# Patient Record
Sex: Male | Born: 1985 | Marital: Single | Smoking: Never smoker
Health system: Southern US, Community
[De-identification: ages and names within clinical notes are randomized; demographics above are authoritative.]

## PROBLEM LIST (undated history)

## (undated) HISTORY — PX: APPENDECTOMY: SHX54

---

## 2013-12-06 ENCOUNTER — Ambulatory Visit: Payer: Self-pay | Admitting: Otolaryngology

## 2014-12-11 ENCOUNTER — Ambulatory Visit (INDEPENDENT_AMBULATORY_CARE_PROVIDER_SITE_OTHER): Payer: BLUE CROSS/BLUE SHIELD | Admitting: Emergency Medicine

## 2014-12-11 ENCOUNTER — Ambulatory Visit (INDEPENDENT_AMBULATORY_CARE_PROVIDER_SITE_OTHER): Payer: BLUE CROSS/BLUE SHIELD

## 2014-12-11 VITALS — BP 124/68 | HR 52 | Temp 97.8°F | Resp 15 | Ht 73.0 in | Wt 218.0 lb

## 2014-12-11 DIAGNOSIS — M79672 Pain in left foot: Secondary | ICD-10-CM
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
## 2014-12-11 NOTE — Progress Notes (Signed)
  Medical screening examination/treatment/procedure(s) were performed by non-physician practitioner and as supervising physician I was immediately available for consultation/collaboration.     

## 2014-12-11 NOTE — Patient Instructions (Signed)
Please wear the cam walker for the next week at home.  When you start feeling less pain in the area, please try toe touch weight bearing. If this feels ok you can progress a little bit each day.  RICE --> rest, ice, compress, elevate often for next couple days.  Aleve every 6 hours for inflammation and pain.  Please come back to see us in approximately one week. We can hopefully progress to stretching and strengthening exercises at that time along with more weight bearing.

## 2014-12-12 ENCOUNTER — Telehealth: Payer: Self-pay | Admitting: Physician Assistant

## 2014-12-12 NOTE — Telephone Encounter (Signed)
Called and left msg with pt. Radiology over read showed possible non displaced fx 5th metatarsal vs normal variant. Left msg informing pt to continue with the cam walker and crutches for one week, no weight bearing until we see him back in one week. Possible repeat xrays at that time. Referral to ortho as needed.

## 2014-12-13 ENCOUNTER — Telehealth: Payer: Self-pay | Admitting: Emergency Medicine

## 2014-12-13 NOTE — Telephone Encounter (Signed)
Patient states that he was unable to return to work on 12/12/2014. He states that his foot is still in a tremendous amount of pain and he works as a [employer] so he is unable work while using crutches. Please advise.   [phone]

## 2014-12-13 NOTE — Telephone Encounter (Signed)
Spoke with pt. He is a [employer] and can't use crutches while he is working. He needs to be out of work. He states there is no other light or sit down duty he can do. Please advise.

## 2014-12-13 NOTE — Telephone Encounter (Signed)
Pt checking on status of this message.  °

## 2014-12-13 NOTE — Telephone Encounter (Signed)
Pt notified that note is ready. Will fax to work for him.

## 2014-12-13 NOTE — Telephone Encounter (Signed)
I've written a note to keep him out of work until he returns to see us early next week.

## 2014-12-18 ENCOUNTER — Ambulatory Visit (INDEPENDENT_AMBULATORY_CARE_PROVIDER_SITE_OTHER): Payer: BLUE CROSS/BLUE SHIELD | Admitting: Physician Assistant

## 2014-12-18 VITALS — BP 130/60 | HR 58 | Temp 97.8°F | Resp 16 | Ht 73.0 in | Wt 219.8 lb

## 2014-12-18 DIAGNOSIS — M79672 Pain in left foot: Secondary | ICD-10-CM

## 2014-12-18 NOTE — Patient Instructions (Signed)
Continue Aleve as you need it. Elevated and ice to help with pain after work.

## 2014-12-18 NOTE — Progress Notes (Signed)
   Subjective:    Patient ID: Cody Krause, male    DOB: 06/09/1986, 29 y.o.   MRN: [id number]  HPI Pt presents to clinic for a recheck of his left foot pain.  He took off the camwalker about 2 days ago and he has been using a supportive boot which has been ok.  The pain is almost gone and he is only feeling like it is sore.  He has been using Aleve but not over the last couple of days.  Review of Systems  Constitutional: Negative for fever and chills.   There are no active problems to display for this patient.  Prior to Admission medications   Not on File   No Known Allergies  Medications, allergies, past medical history, surgical history, family history, social history and problem list reviewed and updated.     Objective:   Physical Exam  Constitutional: He is oriented to person, place, and time.  BP 130/60 mmHg  Pulse 58  Temp(Src) 97.8 F (36.6 C) (Oral)  Resp 16  Ht 6\' 1"  (1.854 m)  Wt 219 lb 12.8 oz (99.701 kg)  BMI 29.01 kg/m2  SpO2 99%   Pulmonary/Chest: Effort normal.  Musculoskeletal:       Left foot: There is normal range of motion and no tenderness.  No TTP, no swelling, ecchymosis presents on the medial aspect of the ankle.  Pt is able to tiptoe without problems.    Neurological: He is alert and oriented to person, place, and time.  Skin: Skin is warm and dry.  Psychiatric: He has a normal mood and affect. His behavior is normal. Judgment and thought content normal.       Assessment & Plan:  Left foot pain  Pt seems improved and is able to walk without limp.  He is able to return to work.  Benny LennertSarah Yeni Jiggetts PA-C  [facility] 12/18/2014 9:15 AM

## 2015-01-29 ENCOUNTER — Telehealth: Payer: Self-pay | Admitting: Emergency Medicine

## 2015-01-29 NOTE — Telephone Encounter (Signed)
Don't know that is within my ability to write off charges

## 2015-01-29 NOTE — Telephone Encounter (Signed)
Dr. Dareen PianoAnderson:  Patient is upset after receiving his bill and having $151.97 applied to his deductible for the boot we put on his foot.  He would like to have that written off because it "didn't help" and he "didn't wear it" and had he been notified of the price he would have refused to take it.  Please review and let me know what you think.  The claim would need to be resubmitted to the insurance if we change his charges.  Thank you!  Angela B-A, 104 billing.
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
## 2020-04-23 ENCOUNTER — Ambulatory Visit (INDEPENDENT_AMBULATORY_CARE_PROVIDER_SITE_OTHER): Payer: 59

## 2020-04-23 ENCOUNTER — Other Ambulatory Visit: Payer: Self-pay

## 2020-04-23 ENCOUNTER — Encounter (HOSPITAL_COMMUNITY): Payer: Self-pay | Admitting: *Deleted

## 2020-04-23 ENCOUNTER — Ambulatory Visit (HOSPITAL_COMMUNITY)
Admission: EM | Admit: 2020-04-23 | Discharge: 2020-04-23 | Disposition: A | Discharge status: Home / Self Care | Payer: 59

## 2020-04-23 DIAGNOSIS — S99912A Unspecified injury of left ankle, initial encounter: Secondary | ICD-10-CM

## 2020-04-23 DIAGNOSIS — S92145A Nondisplaced dome fracture of left talus, initial encounter for closed fracture: Secondary | ICD-10-CM
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
## 2020-04-23 NOTE — Progress Notes (Signed)
Orthopedic Tech Progress Note Patient Details:  Cody Krause 21-Jul-1986 [id number] Patient refused short leg splint. I let provider know and she spoke to patient and offered him a CAM walker. Patient also refused CAM walker. Patient ID: Cody Krause, male   DOB: [birth year], 34 y.o.   MRN: [id number]   Cody Krause 04/23/2020, 3:51 PM

## 2020-04-23 NOTE — Discharge Instructions (Addendum)
You have a talus fracture We are placing you in a splint and you need to be non weight bearing.  Rest, Ice, Elevate.  Call orthopedics for follow up You need a CT scan.  You can take Ibuprofen for pain as needed
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
## 2020-04-24 ENCOUNTER — Other Ambulatory Visit: Payer: Self-pay | Admitting: Orthopedic Surgery

## 2020-04-24 DIAGNOSIS — M25572 Pain in left ankle and joints of left foot: Secondary | ICD-10-CM

## 2020-04-25 ENCOUNTER — Ambulatory Visit
Admission: RE | Admit: 2020-04-25 | Discharge: 2020-04-25 | Disposition: A | Discharge status: Home / Self Care | Payer: 59 | Source: Ambulatory Visit | Attending: Orthopedic Surgery | Admitting: Orthopedic Surgery

## 2020-04-25 ENCOUNTER — Other Ambulatory Visit: Payer: Self-pay

## 2020-04-25 DIAGNOSIS — M25572 Pain in left ankle and joints of left foot: Secondary | ICD-10-CM

## 2020-05-07 ENCOUNTER — Other Ambulatory Visit: Payer: 59
